# Patient Record
Sex: Male | Born: 1960 | Hispanic: Yes | Marital: Married | Smoking: Never smoker
Health system: Southern US, Community
[De-identification: ages and names within clinical notes are randomized; demographics above are authoritative.]

## PROBLEM LIST (undated history)

## (undated) DIAGNOSIS — I10 Essential (primary) hypertension: Secondary | ICD-10-CM

---

## 2014-08-01 ENCOUNTER — Emergency Department (HOSPITAL_COMMUNITY)
Admission: EM | Admit: 2014-08-01 | Discharge: 2014-08-01 | Disposition: A | Discharge status: Home / Self Care | Payer: Worker's Compensation | Attending: Emergency Medicine | Admitting: Emergency Medicine

## 2014-08-01 ENCOUNTER — Encounter (HOSPITAL_COMMUNITY): Payer: Self-pay | Admitting: Emergency Medicine

## 2014-08-01 ENCOUNTER — Emergency Department (HOSPITAL_COMMUNITY): Payer: Worker's Compensation

## 2014-08-01 DIAGNOSIS — M5442 Lumbago with sciatica, left side: Secondary | ICD-10-CM

## 2014-08-01 DIAGNOSIS — Y9389 Activity, other specified: Secondary | ICD-10-CM | POA: Insufficient documentation

## 2014-08-01 DIAGNOSIS — S0081XA Abrasion of other part of head, initial encounter: Secondary | ICD-10-CM | POA: Insufficient documentation

## 2014-08-01 DIAGNOSIS — Y9289 Other specified places as the place of occurrence of the external cause: Secondary | ICD-10-CM | POA: Insufficient documentation

## 2014-08-01 DIAGNOSIS — S39012A Strain of muscle, fascia and tendon of lower back, initial encounter: Secondary | ICD-10-CM | POA: Insufficient documentation

## 2014-08-01 DIAGNOSIS — W19XXXA Unspecified fall, initial encounter: Secondary | ICD-10-CM

## 2014-08-01 DIAGNOSIS — S8001XA Contusion of right knee, initial encounter: Secondary | ICD-10-CM | POA: Insufficient documentation

## 2014-08-01 DIAGNOSIS — W1809XA Striking against other object with subsequent fall, initial encounter: Secondary | ICD-10-CM | POA: Insufficient documentation

## 2014-08-01 DIAGNOSIS — S3992XA Unspecified injury of lower back, initial encounter: Secondary | ICD-10-CM | POA: Insufficient documentation

## 2014-08-01 DIAGNOSIS — I10 Essential (primary) hypertension: Secondary | ICD-10-CM | POA: Insufficient documentation

## 2014-08-01 HISTORY — DX: Essential (primary) hypertension: I10

## 2014-08-01 MED ORDER — METHOCARBAMOL 750 MG PO TABS: 750.0000 mg | ORAL_TABLET | Freq: Four times a day (QID) | ORAL | Status: AC | PRN

## 2014-08-01 MED ORDER — HYDROCODONE-ACETAMINOPHEN 5-325 MG PO TABS: 1.0000 | ORAL_TABLET | Freq: Four times a day (QID) | ORAL | Status: AC | PRN

## 2014-08-01 MED ORDER — HYDROCODONE-ACETAMINOPHEN 5-325 MG PO TABS
2.0000 | ORAL_TABLET | Freq: Once | ORAL | Status: AC
  Administered 2014-08-01: 2 via ORAL
  Filled 2014-08-01: qty 2

## 2014-08-01 MED ORDER — METHOCARBAMOL 500 MG PO TABS
500.0000 mg | ORAL_TABLET | Freq: Once | ORAL | Status: AC
  Administered 2014-08-01: 500 mg via ORAL
  Filled 2014-08-01: qty 1

## 2014-08-01 MED ORDER — NAPROXEN 500 MG PO TABS: 500.0000 mg | ORAL_TABLET | Freq: Two times a day (BID) | ORAL | Status: AC | PRN

## 2014-08-01 NOTE — ED Notes (Signed)
Pt to xray

## 2014-08-01 NOTE — ED Notes (Signed)
The pt returned from xray.  c-collar still in place

## 2014-08-01 NOTE — ED Notes (Signed)
Pt still in x-ray

## 2014-08-01 NOTE — ED Notes (Signed)
Walked in hallway very well no difficult .

## 2014-08-01 NOTE — ED Notes (Signed)
Pain med given 

## 2014-08-01 NOTE — ED Notes (Signed)
The pts wife has called in.  She does not drive.  He will have to call his boss

## 2014-08-01 NOTE — ED Provider Notes (Signed)
CSN: [id number]     Arrival date & time 08/01/14  1604 History   First MD Initiated Contact with Patient 08/01/14 1604     Chief Complaint  Patient presents with  . Fall     (Consider location/radiation/quality/duration/timing/severity/associated sxs/prior Treatment) The history is provided by the patient and medical records. No language interpreter was used.    Harvel RicksRene Patti is a 53 y.o. 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  Patient reports hitting his face on the ground with fall and subsequent visual changes for several minutes which have resolved and have not returned. Pt also c/o new paresthesias of the left leg since the fall without saddle anesthesia or loss of bowel/bladder control.  Patient arrives via EMS fully immobilized due to mechanism and c/o leg paresthesias. Associated symptoms include right knee pain, right hand pain, nausea.  Patient reports and Zofran given by EMS resolved nausea. Movement makes his back pain knee pain and hand pain worse.  He complains of abrasion to the left cheek.  Pt denies use of blood thinners, fever, headache, neck pain, chest pain or shortness of breath, abdominal pain, vomiting, weakness, syncope.     Past Medical History  Diagnosis Date  . Hypertension    History reviewed. No pertinent past surgical history. No family history on file. History  Substance Use Topics  . Smoking status: Never Smoker   . Smokeless tobacco: Not on file  . Alcohol Use: No    Review of Systems  Constitutional: Negative for fever, chills and fatigue.  HENT: Negative for dental problem, facial swelling and nosebleeds.   Eyes: Negative for visual disturbance.  Respiratory: Negative for cough, chest tightness, shortness of breath, wheezing and stridor.    Cardiovascular: Negative for chest pain.  Gastrointestinal: Positive for nausea. Negative for vomiting, abdominal pain and diarrhea.  Genitourinary: Negative for dysuria, urgency, frequency, hematuria and flank pain.  Musculoskeletal: Positive for arthralgias and back pain. Negative for joint swelling, neck pain and neck stiffness.  Skin: Negative for rash and wound.  Neurological: Negative for syncope, weakness, light-headedness, numbness and headaches.  Hematological: Does not bruise/bleed easily.  Psychiatric/Behavioral: The patient is not nervous/anxious.   All other systems reviewed and are negative.     Allergies  Review of patient's allergies indicates no known allergies.  Home Medications   Prior to Admission medications   Not on File   BP 128/76  Pulse 72  Temp(Src) 97.5 F (36.4 C) (Oral)  Resp 18  Ht 5\' 8"  (1.727 m)  Wt 212 lb (96.163 kg)  BMI 32.24 kg/m2  SpO2 99% Physical Exam  Nursing note and vitals reviewed. Constitutional: He is oriented to person, place, and time. He appears well-developed and well-nourished. No distress.  HENT:  Head: Normocephalic.  Right Ear: Tympanic membrane, external ear and ear canal normal.  Left Ear: Tympanic membrane, external ear and ear canal normal.  Nose: Nose normal. No epistaxis. Right sinus exhibits no maxillary sinus tenderness and no frontal sinus tenderness. Left sinus exhibits no maxillary sinus tenderness and no frontal sinus tenderness.  Mouth/Throat: Uvula is midline, oropharynx is clear and moist and mucous membranes are normal. Mucous membranes are not pale and not cyanotic. No oropharyngeal exudate, posterior oropharyngeal edema, posterior oropharyngeal erythema or tonsillar abscesses.  Small abrasion to the  left cheek  Eyes: Conjunctivae and EOM are normal. Pupils are equal, round, and reactive to light.  Neck: Normal range of motion and full passive range of motion without pain. No spinous process tenderness  and no muscular tenderness present. No rigidity. Normal range of motion present.  C-collar in place - ROM testing deferred Vague midline cervical tenderness; bilateral paraspinal tenderness  Cardiovascular: Normal rate, regular rhythm, normal heart sounds and intact distal pulses.   No murmur heard. Pulses:      Radial pulses are 2+ on the right side, and 2+ on the left side.       Dorsalis pedis pulses are 2+ on the right side, and 2+ on the left side.       Posterior tibial pulses are 2+ on the right side, and 2+ on the left side.  Pulmonary/Chest: Effort normal and breath sounds normal. No accessory muscle usage or stridor. No respiratory distress. He has no decreased breath sounds. He has no wheezes. He has no rhonchi. He has no rales. He exhibits no tenderness and no bony tenderness.  No ecchymosis or contusion No flail segment, crepitus or deformity Equal chest expansion Clear and equal breath sounds  Abdominal: Soft. Normal appearance and bowel sounds are normal. There is no tenderness. There is no rigidity, no guarding and no CVA tenderness.  Small 3x3cm area of healing ecchymosis - pt reports this occurred from a mechanical fall last week Abd soft and nontender; no rigidity or peritoneal signs  Genitourinary:  sensation intact to the perineum  Musculoskeletal: Normal range of motion.       Thoracic back: He exhibits normal range of motion.       Lumbar back: He exhibits normal range of motion.  Full range of motion of the T-spine and L-spine Mild tenderness to palpation of the spinous processes of the T-spine; No tenderness to palpation of the spinous processes of the L-spine Mild tenderness to palpation of the paraspinous muscles of the L-spine Full ROM of the left leg without difficulty Full ROM of the right leg including the right knee without difficulty, but with pain in the knee; contusion to the medial right knee  Lymphadenopathy:    He has no cervical adenopathy.   Neurological: He is alert and oriented to person, place, and time. No cranial nerve deficit. GCS eye subscore is 4. GCS verbal subscore is 5. GCS motor subscore is 6.  Reflex Scores:      Tricep reflexes are 2+ on the right side and 2+ on the left side.      Bicep reflexes are 2+ on the right side and 2+ on the left side.      Brachioradialis reflexes are 2+ on the right side and 2+ on the left side.      Patellar reflexes are 2+ on the right side and 2+ on the left side.      Achilles reflexes are 2+ on the right side and 2+ on the left side. Mental Status:  Alert, oriented, thought content appropriate. Speech fluent without evidence of aphasia. Able to follow 2 step commands without difficulty.  Cranial Nerves:  II:  Peripheral visual fields grossly normal, pupils equal, round, reactive to light III,IV, VI: ptosis not present, extra-ocular motions intact bilaterally  V,VII: smile symmetric, facial light touch sensation equal VIII: hearing grossly normal bilaterally  IX,X: gag reflex present  XI: bilateral shoulder shrug equal and strong XII: midline tongue extension  Motor:  5/5 in upper and lower  extremities bilaterally including strong and equal grip strength and dorsiflexion/plantar flexion Sensory: Pinprick and light touch normal in all extremities though pt endorses increased paresthesias of the left leg with palpation Deep Tendon Reflexes: 2+ and symmetric Bilateral upper and lower extremities Cerebellar: normal finger-to-nose with bilateral upper extremities Gait: gait testing deferred CV: distal pulses palpable throughout   Skin: Skin is warm and dry. No rash noted. He is not diaphoretic. No erythema.  Psychiatric: He has a normal mood and affect.    ED Course  Procedures (including critical care time) Labs Review Labs Reviewed - No data to display  Imaging Review Dg Thoracic Spine 2 View  08/01/2014   CLINICAL DATA:  Initial evaluation for fall off a truck today,  diffuse back pain  EXAM: THORACIC SPINE - 2 VIEW  COMPARISON:  None.  FINDINGS: Normal alignment. No fracture. No paraspinous hematoma. Mild multilevel degenerative disc disease.  IMPRESSION: No acute finding   Electronically Signed   By: Esperanza Heir M.D.   On: 08/01/2014 17:39   Dg Lumbar Spine Complete  08/01/2014   CLINICAL DATA:  Generalized back pain after falling from his truck today.  EXAM: LUMBAR SPINE - COMPLETE 4+ VIEW  COMPARISON:  None.  FINDINGS: Transitional thoracolumbar and lumbosacral vertebrae with 4 intervening non-rib-bearing lumbar vertebrae. Minimal anterior spur formation at multiple levels. No fractures, pars defects or subluxations.  IMPRESSION: No fracture or subluxation.  Minimal degenerative changes.   Electronically Signed   By: Gordan Payment M.D.   On: 08/01/2014 17:41   Ct Head Wo Contrast  08/01/2014   CLINICAL DATA:  Visual changes following a fall. The patient fell getting out of a truck at work today.  EXAM: CT HEAD WITHOUT CONTRAST  CT CERVICAL SPINE WITHOUT CONTRAST  TECHNIQUE: Multidetector CT imaging of the head and cervical spine was performed following the standard protocol without intravenous contrast. Multiplanar CT image reconstructions of the cervical spine were also generated.  COMPARISON:  None.  FINDINGS: CT HEAD FINDINGS  Normal appearing cerebral hemispheres and posterior fossa structures. Normal size and position of the ventricles. No skull fracture, intracranial hemorrhage or paranasal sinus air-fluid levels. Mild bilateral maxillary, ethmoid, frontal and sphenoid sinus mucosal thickening.  CT CERVICAL SPINE FINDINGS  Lower cervical spine degenerative changes. No prevertebral soft tissue swelling, fractures or subluxations.  IMPRESSION: 1. No skull fracture or intracranial hemorrhage. 2. No cervical spine fracture or subluxation. 3. Mild chronic pansinusitis. 4. Lower cervical spine degenerative changes.   Electronically Signed   By: Gordan Payment M.D.    On: 08/01/2014 17:35   Ct Cervical Spine Wo Contrast  08/01/2014   CLINICAL DATA:  Visual changes following a fall. The patient fell getting out of a truck at work today.  EXAM: CT HEAD WITHOUT CONTRAST  CT CERVICAL SPINE WITHOUT CONTRAST  TECHNIQUE: Multidetector CT imaging of the head and cervical spine was performed following the standard protocol without intravenous contrast. Multiplanar CT image reconstructions of the cervical spine were also generated.  COMPARISON:  None.  FINDINGS: CT HEAD FINDINGS  Normal appearing cerebral hemispheres and posterior fossa structures. Normal size and position of the ventricles. No skull fracture, intracranial hemorrhage or paranasal sinus air-fluid levels. Mild bilateral maxillary, ethmoid, frontal and sphenoid sinus mucosal thickening.  CT CERVICAL SPINE FINDINGS  Lower cervical spine degenerative changes. No prevertebral soft tissue swelling, fractures or subluxations.  IMPRESSION: 1. No skull fracture or intracranial hemorrhage. 2. No cervical spine fracture or subluxation. 3. Mild chronic pansinusitis. 4.  Lower cervical spine degenerative changes.   Electronically Signed   By: Gordan PaymentSteve  Reid M.D.   On: 08/01/2014 17:35   Dg Knee Complete 4 Views Right  08/01/2014   CLINICAL DATA:  Initial evaluation for right knee pain after falling off a truck today  EXAM: RIGHT KNEE - COMPLETE 4+ VIEW  COMPARISON:  None.  FINDINGS: There is no evidence of fracture, dislocation, or joint effusion. There is no evidence of arthropathy or other focal bone abnormality. Soft tissues are unremarkable.  IMPRESSION: Negative.   Electronically Signed   By: Esperanza Heiraymond  Rubner M.D.   On: 08/01/2014 17:40     EKG Interpretation None      MDM   Final diagnoses:  Fall, initial encounter  Bilateral low back pain with left-sided sciatica   Harvel RicksRene Edmunds presents with low back pain, mild headache and radiation of pain with associated paresthesias to the left leg after falling.  Normal  neurological exam with gait testing deferred. No concern for lung injury, or intraabdominal injury. Low suspicion for closed head injury.  Normal muscle soreness after fall.   6:45PM Pt reports complete resolution of paresthesias, but persistence of low back pain.  Will give pain control and reassess.  Radiology without acute abnormality.  C-collar removed with a full range of motion of the neck. Patient c-spine clinically cleared.    7:08 PM Patient is able to ambulate without difficulty in the ED and will be discharged home with symptomatic therapy. Repeat neurologic exam is normal. Pt has been instructed to follow up with their doctor if symptoms persist. Home conservative therapies for pain including ice and heat tx have been discussed. Pt is hemodynamically stable, in NAD. Pain has been managed & has no complaints prior to dc.  I have personally reviewed patient's vitals, nursing note and any pertinent labs or imaging.  I performed an undressed physical exam.    It has been determined that no acute conditions requiring further emergency intervention are present at this time. The patient/guardian have been advised of the diagnosis and plan. I reviewed all labs and imaging including any potential incidental findings. We have discussed signs and symptoms that warrant return to the ED, such as loss of bowel or bladder control, saddle anesthesia, worsening pain.  Patient/guardian has voiced understanding and agreed to follow-up with the PCP or specialist in 3 days.  Vital signs are stable at discharge.   BP 128/76  Pulse 72  Temp(Src) 97.5 F (36.4 C) (Oral)  Resp 18  Ht 5\' 8"  (1.727 m)  Wt 212 lb (96.163 kg)  BMI 32.24 kg/m2  SpO2 99%         Dierdre ForthHannah Tanaya Dunigan, PA-C 08/01/14 1911  Dierdre ForthHannah Keisha Amer, PA-C 08/01/14 1913

## 2014-08-01 NOTE — ED Notes (Signed)
The pt arrived by gems he fell getting out of a truck at work.  Abrasions scattered over his upper torso.  No loc.  Numbness lt leg.  lsb removed on arrival to the room. c-c-ollar remains in palce

## 2014-08-01 NOTE — Discharge Instructions (Signed)
1. Medications: robaxin, naproxyn, vicodin, usual home medications 2. Treatment: rest, drink plenty of fluids, gentle stretching as discussed, alternate ice and heat 3. Follow Up: Please followup with your primary doctor for discussion of your diagnoses and further evaluation after today's visit; if you do not have a primary care doctor use the resource guide provided to find one;    Back Exercises Back exercises help treat and prevent back injuries. The goal of back exercises is to increase the strength of your abdominal and back muscles and the flexibility of your back. These exercises should be started when you no longer have back pain. Back exercises include:  Pelvic Tilt. Lie on your back with your knees bent. Tilt your pelvis until the lower part of your back is against the floor. Hold this position 5 to 10 sec and repeat 5 to 10 times.  Knee to Chest. Pull first 1 knee up against your chest and hold for 20 to 30 seconds, repeat this with the other knee, and then both knees. This may be done with the other leg straight or bent, whichever feels better.  Sit-Ups or Curl-Ups. Bend your knees 90 degrees. Start with tilting your pelvis, and do a partial, slow sit-up, lifting your trunk only 30 to 45 degrees off the floor. Take at least 2 to 3 seconds for each sit-up. Do not do sit-ups with your knees out straight. If partial sit-ups are difficult, simply do the above but with only tightening your abdominal muscles and holding it as directed.  Hip-Lift. Lie on your back with your knees flexed 90 degrees. Push down with your feet and shoulders as you raise your hips a couple inches off the floor; hold for 10 seconds, repeat 5 to 10 times.  Back arches. Lie on your stomach, propping yourself up on bent elbows. Slowly press on your hands, causing an arch in your low back. Repeat 3 to 5 times. Any initial stiffness and discomfort should lessen with repetition over time.  Shoulder-Lifts. Lie face down  with arms beside your body. Keep hips and torso pressed to floor as you slowly lift your head and shoulders off the floor. Do not overdo your exercises, especially in the beginning. Exercises may cause you some mild back discomfort which lasts for a few minutes; however, if the pain is more severe, or lasts for more than 15 minutes, do not continue exercises until you see your caregiver. Improvement with exercise therapy for back problems is slow.  See your caregivers for assistance with developing a proper back exercise program. Document Released: 11/09/2004 Document Revised: 12/25/2011 Document Reviewed: 08/03/2011 [facility] Patient Information 2015 [place], [place]. This information is not intended to replace advice given to you by your health care provider. Make sure you discuss any questions you have with your health care provider.  Lumbosacral Strain Lumbosacral strain is a strain of any of the parts that make up your lumbosacral vertebrae. Your lumbosacral vertebrae are the bones that make up the lower third of your backbone. Your lumbosacral vertebrae are held together by muscles and tough, fibrous tissue (ligaments).  CAUSES  A sudden blow to your back can cause lumbosacral strain. Also, anything that causes an excessive stretch of the muscles in the low back can cause this strain. This is typically seen when people exert themselves strenuously, fall, lift heavy objects, bend, or crouch repeatedly. RISK FACTORS  Physically demanding work.  Participation in pushing or pulling sports or sports that require a sudden twist of the  back (tennis, golf, baseball).  Weight lifting.  Excessive lower back curvature.  Forward-tilted pelvis.  Weak back or abdominal muscles or both.  Tight hamstrings. SIGNS AND SYMPTOMS  Lumbosacral strain may cause pain in the area of your injury or pain that moves (radiates) down your leg.  DIAGNOSIS Your health care provider can often diagnose lumbosacral  strain through a physical exam. In some cases, you may need tests such as X-ray exams.  TREATMENT  Treatment for your lower back injury depends on many factors that your clinician will have to evaluate. However, most treatment will include the use of anti-inflammatory medicines. HOME CARE INSTRUCTIONS   Avoid hard physical activities (tennis, racquetball, waterskiing) if you are not in proper physical condition for it. This may aggravate or create problems.  If you have a back problem, avoid sports requiring sudden body movements. Swimming and walking are generally safer activities.  Maintain good posture.  Maintain a healthy weight.  For acute conditions, you may put ice on the injured area.  Put ice in a plastic bag.  Place a towel between your skin and the bag.  Leave the ice on for 20 minutes, 2-3 times a day.  When the low back starts healing, stretching and strengthening exercises may be recommended. SEEK MEDICAL CARE IF:  Your back pain is getting worse.  You experience severe back pain not relieved with medicines. SEEK IMMEDIATE MEDICAL CARE IF:   You have numbness, tingling, weakness, or problems with the use of your arms or legs.  There is a change in bowel or bladder control.  You have increasing pain in any area of the body, including your belly (abdomen).  You notice shortness of breath, dizziness, or feel faint.  You feel sick to your stomach (nauseous), are throwing up (vomiting), or become sweaty.  You notice discoloration of your toes or legs, or your feet get very cold. MAKE SURE YOU:   Understand these instructions.  Will watch your condition.  Will get help right away if you are not doing well or get worse. Document Released: 07/12/2005 Document Revised: 10/07/2013 Document Reviewed: 05/21/2013 [facility] Patient Information 2015 [place]. This information is not intended to replace advice given to you by your health care provider. Make sure  you discuss any questions you have with your health care provider.    Emergency Department Resource Guide 1) Find a Doctor and Pay Out of Pocket Although you won't have to find out who is covered by your insurance plan, it is a good idea to ask around and get recommendations. You will then need to call the office and see if the doctor you have chosen will accept you as a new patient and what types of options they offer for patients who are self-pay. Some doctors offer discounts or will set up payment plans for their patients who do not have insurance, but you will need to ask so you aren't surprised when you get to your appointment.  2) Contact Your Local Health Department Not all health departments have doctors that can see patients for sick visits, but many do, so it is worth a call to see if yours does. If you don't know where your local health department is, you can check in your phone book. The CDC also has a tool to help you locate your state's health department, and many state websites also have listings of all of their local health departments.  3) Find a Walk-in Clinic If your illness is not likely  to be very severe or complicated, you may want to try a walk in clinic. These are popping up all over the country in pharmacies, drugstores, and shopping centers. They're usually staffed by nurse practitioners or physician assistants that have been trained to treat common illnesses and complaints. They're usually fairly quick and inexpensive. However, if you have serious medical issues or chronic medical problems, these are probably not your best option.  No Primary Care Doctor: - Call Health Connect at  [phone] - they can help you locate a primary care doctor that  accepts your insurance, provides certain services, etc. - Physician Referral Service- [phone]  Chronic Pain Problems: Organization         Address  Phone   Notes  [facility]  [phone] Patients need  to be referred by their primary care doctor.   Medication Assistance: Organization         Address  Phone   Notes  [facility] Medication [facility] [address] [place] [place], [place] [address] [phone] --Must be a resident of [facility] -- Must have NO insurance coverage whatsoever (no Medicaid/ Medicare, etc.) -- The pt. MUST have a primary care doctor that directs their care regularly and follows them in the community   [facility]  [phone]   [employer]  [phone]    Agencies that provide inexpensive medical care: Organization         Address  Phone   Notes  [facility]  [phone]   [facility]    [phone]   [facility] [address] [place], [place] [address] [phone]   [facility] [address] [place] [place] [phone]   Planned Parenthood    [phone]   [facility]    [phone]   [facility]  [address], [place] Phone:  [phone], Fax:  [phone] Hours of Operation:  9 am - 6 pm, M-F.  Also accepts Medicaid/Medicare and self-pay.  [facility]  [address], [place] Phone: [phone], Fax: [phone]. Hours of Operation:  8:30 am - 5:30 pm, M-F.  Also accepts Medicaid and self-pay.  [facility] [place] [address], [place] Phone: [phone]   [facility] [address] Natasha Bence [place], [place] [phone], Ext. [phone] Mondays & Thursdays: 7-9 AM.  First 15 patients are seen on a first come, first serve basis.    Medicaid-accepting [facility] Providers:  Organization         Address  Phone   Notes  [facility] [address], [place] [phone] Also accepts self-pay patients.  [facility] [address] Laurell Josephs [place], [place]  [phone]   [facility] [address], [place] [phone]   [facility] [address] [place] [phone]   Renaye Rakers [address], [place]   [phone] Only accepts [place] patients after they have their name applied to their card.   Self-Pay (no insurance) in [facility]:  Organization         Address  Phone   Notes  Sickle Cell Patients, [facility] [address] [place], [place] [phone]   [facility] [address] [place], [place] [phone]   [facility] [place]  [address], [place] [phone]   [facility]/Dr. Osei-Bonsu  [address] [place] or [address], [place] [phone] Phone number for both [place] and [place] locations is the same.  [facility] [address], [place] [phone]   [facility] [address] [place] or [address] [phone]   [facility] [address] [place] [phone], phone; [phone], fax Sees patients 1st and 3rd Saturday of every month.  Must not qualify for public or private insurance (i.e. Medicaid, Medicare, [place] Health Choice, Veterans' Benefits)  Household income should be no more than 200% of the poverty level The clinic cannot treat you if you are pregnant or think you are pregnant  Sexually transmitted diseases are not treated at the clinic.   Dental Care: Organization         Address  Phone  Notes  [facility] Department of [facility] [address] [place], [place] [phone] Accepts children up to age 35 who are enrolled in [place] or [place] Health Choice; pregnant women with a Medicaid card; and children who have applied for Medicaid or [place] Health Choice, but were declined, whose  parents can pay a reduced fee at time of service.  [facility] Department of [facility]  [address], [place] [phone] Accepts children up to age 76 who are enrolled in [place] or [place] Health Choice; pregnant women with a Medicaid card; and children who have applied for Medicaid or [place] Health Choice, but were declined, whose parents can pay a reduced fee at time of service.  [facility]  [address] [place], [place] [phone] Patients are seen by appointment only. Walk-ins are not accepted. [facility] will see patients 22 years of age and older. Monday - Tuesday (8am-5pm) Most Wednesdays (8:30-5pm) $30 per visit, cash only  [facility]  [address], [facility] [phone] Patients are seen by appointment only. Walk-ins are not accepted. [facility] will see patients 21 years of age and older. One Wednesday Evening (Monthly: Volunteer Based).  $30 per visit, cash only  [employer]  [phone] for adults; Children under age 72, call Graduate Pediatric Dentistry at [phone]. Children aged 41-14, please call [phone] to request a pediatric application.  Dental services are provided in all areas of dental care including fillings, crowns and bridges, complete and partial dentures, implants, gum treatment, root canals, and extractions. Preventive care is also provided. Treatment is provided to both adults and children. Patients are selected via a lottery and there is often a waiting list.   [facility] [address] [place]  [phone] [email or web address]   [facility] [address] [place], [place] [phone], Ext. [phone] Second and Fourth Thursday of each month, opens at 6:30 AM; Clinic ends at 9 AM.  Patients are seen on a first-come first-served basis, and a limited number are seen during each clinic.   [facility]   [address] Ether Griffins [place], [place] [phone]   Eligibility Requirements You must have lived in [place], [place], or [place] counties for at least the last three months.  You cannot be eligible for state or federal sponsored [place] Cityhealthcare insurance, including CIG[employer]eterans Administration, [place]icaid, or [employer]Medicare.   You generally cannot be eligible for healthcare insurance through your employer.    How to apply: Eligibility screenings are held every Tuesday and Wednesday afternoon from 1:00 pm until 4:00 pm. You do not need an appointment for the interview!  [facility] [address], [place], [place] [phone]   [facility]  [phone]   [facility]  [phone]   [facility]  [phone]    Behavioral Health Resources in the Community: Intensive Outpatient Programs Organization         Address  Phone  Notes  [facility] [address], [place], [place] [phone]   [facility] [address], [place], [place] [phone]   [facility] [address], [place], [place]  [phone]   [facility] [address],  [place], [place] [phone] or [phone]   Substance Abuse Resources Organization         Address  Phone  Notes  [facility]  [phone]   [facility]  [phone]   [facility]  [phone]   [facility]  [phone]   Residential & Outpatient Substance Abuse Program  [phone]   Psychological Services Organization         Address  Phone  Notes  [facility]  [phone]- [phone]   [facility]  [phone]- [phone]   [facility] [address], [place] [phone] or [phone]    Mobile Crisis Teams Organization         Address  Phone  Notes  [facility], Mobile Crisis Care Unit  [phone]     [facility]  [address] [place], [place] [phone]   Doristine LocksSharon DeEsch [address] [place] [phone]    Self-Help/Support Groups Organization         Address  Phone             Notes  [facility] [place] - variety of support groups  336- [phone] Call for more information  Narcotics Anonymous (NA), [facility] [address], [place]  2 meetings at this location   Statisticianesidential Treatment Programs Organization         Address  Phone  Notes  [facility] [address],    [place]  [phone]   [facility]  [address], [place] [phone], [place], [place] [phone]   [facility] [address], [place] [phone] Admissions: 8am-3pm M-F  [facility] [address]    [place], [place] [phone]   [facility] [address], [place], [place] [phone]   [facility] [address]  [place], [place] [phone]   [facility] - Intensive Outpatient [address], [place], [place] [phone]   [facility] ([facility] [address] [place] [address]  [place], [place] [phone] or [phone]   [facility] ([facility]) [address] [place], [place] [phone] Accepts Medicaid  [facility] [address]  [place] [phone] Substance Abuse/Addiction Treatment   [facility] Resources Organization         Address  Phone  Notes  [facility]  [phone]   Angie FavaJulie Brannon, PhD [phone] [address], [address] [place], [place]   [phone] or [phone]   [facility]   [address] [place], [place] [phone](336) [phone]   [facility] [address], [place], [place] [phone] Insurance/Medicaid/sponsorship through [facility]  [facility] [address]                                     [place], [place] [phone] Therapy/tele-psych/case  [facility] [address]   [place], [place] [phone]    Dr. Lolly MustacheArfeen  [phone]   [facility]  [employer] [facility] 1) [address],  2) [address], [place] 3)  [address], [place] [phone]) [phone]  [phone]   [facility] Child Abuse Hotline [phone]) [phone] or [phone]) [phone] (After Hours)

## 2014-08-01 NOTE — ED Notes (Signed)
Pt c/oa severe headache ?

## 2014-08-02 NOTE — ED Provider Notes (Signed)
Medical screening examination/treatment/procedure(s) were performed by non-physician practitioner and as supervising physician I was immediately available for consultation/collaboration.  Lila Lufkin L Carlisia Geno, MD 08/02/14 0120 

## 2016-01-10 IMAGING — CR DG LUMBAR SPINE COMPLETE 4+V
5 series · 5 of 5 positions shown · non-contrast
Comparison: None.

CLINICAL DATA: Generalized back pain after falling from his truck
today.

EXAM:
LUMBAR SPINE - COMPLETE 4+ VIEW

[t l-spine a.p.]
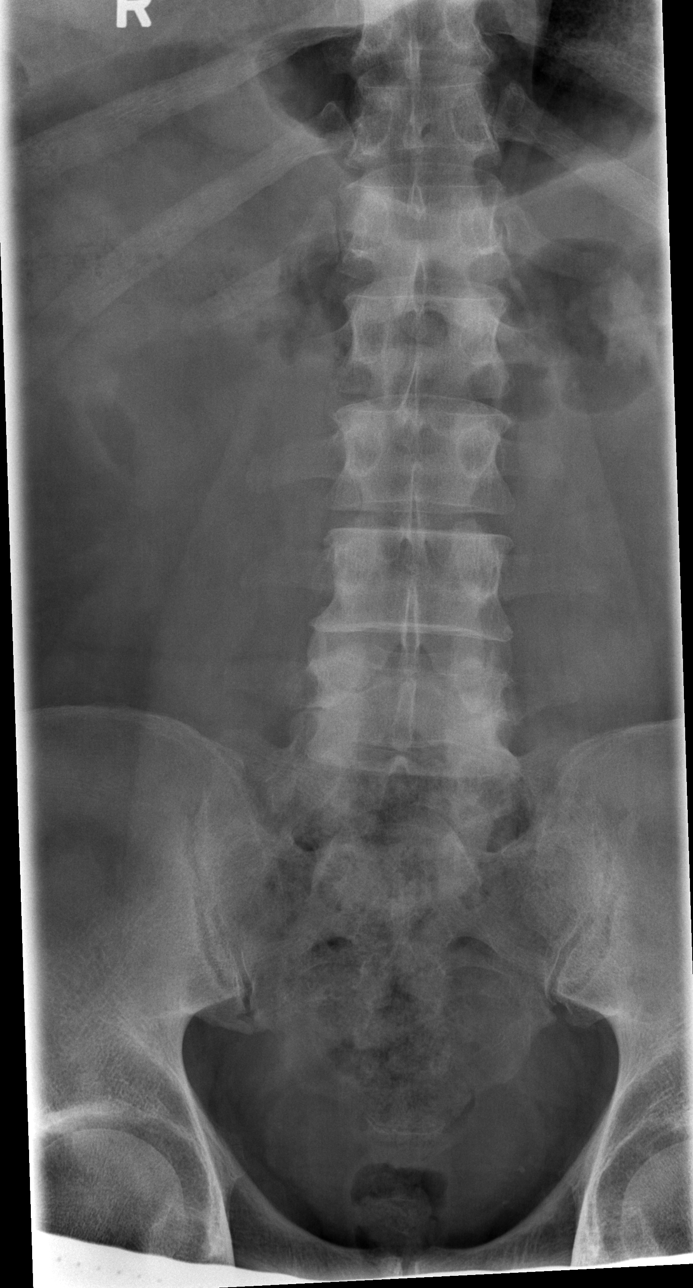

[t l-spine oblique exposure (1 of 2)]
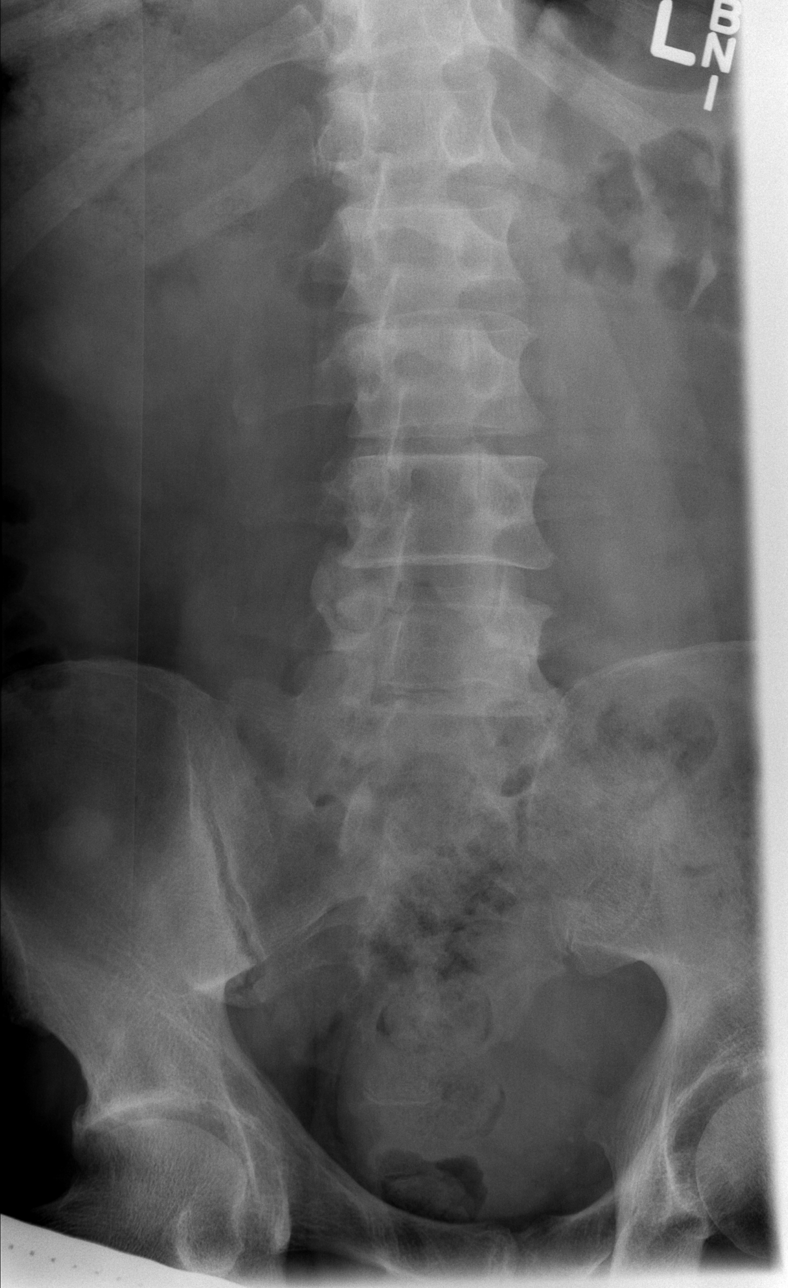

[t l-spine oblique exposure (2 of 2)]
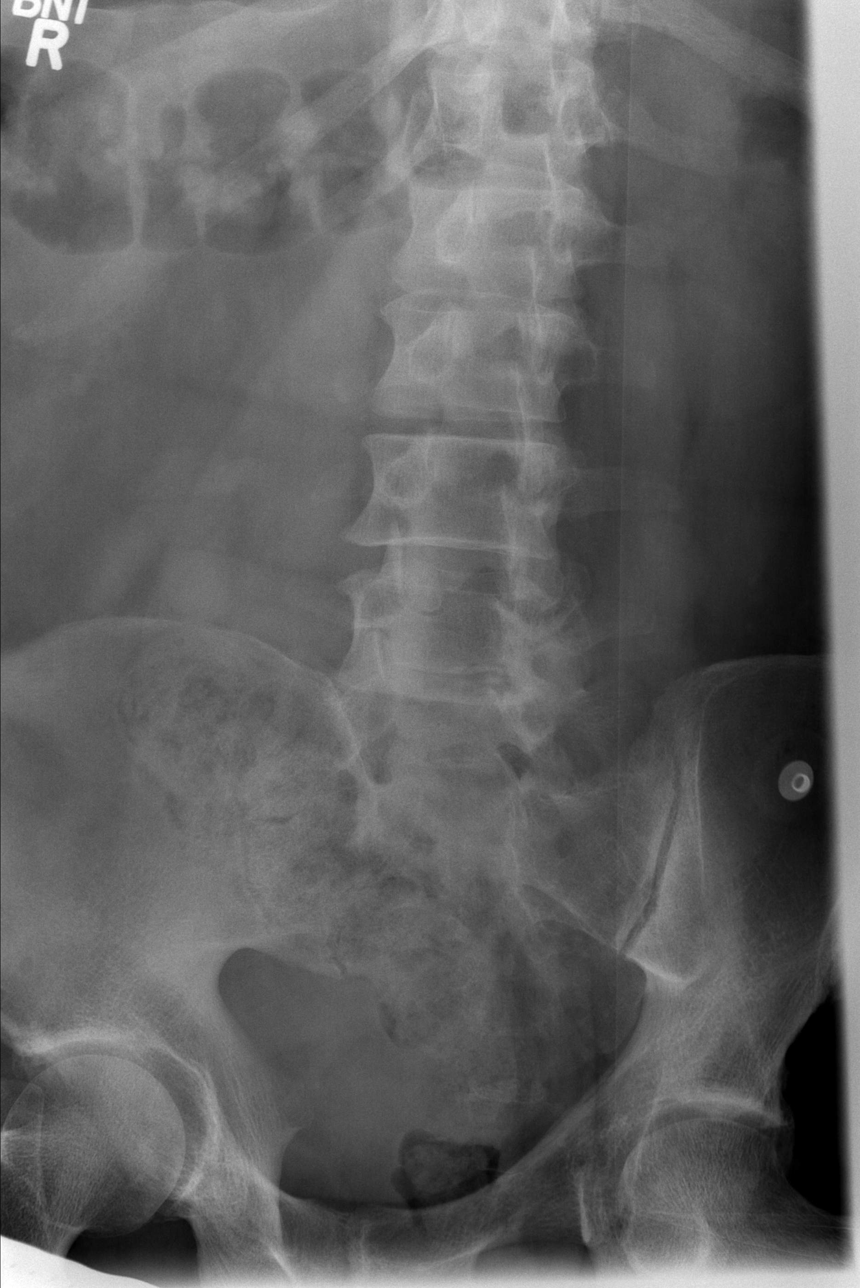

[t l-spine lat *]
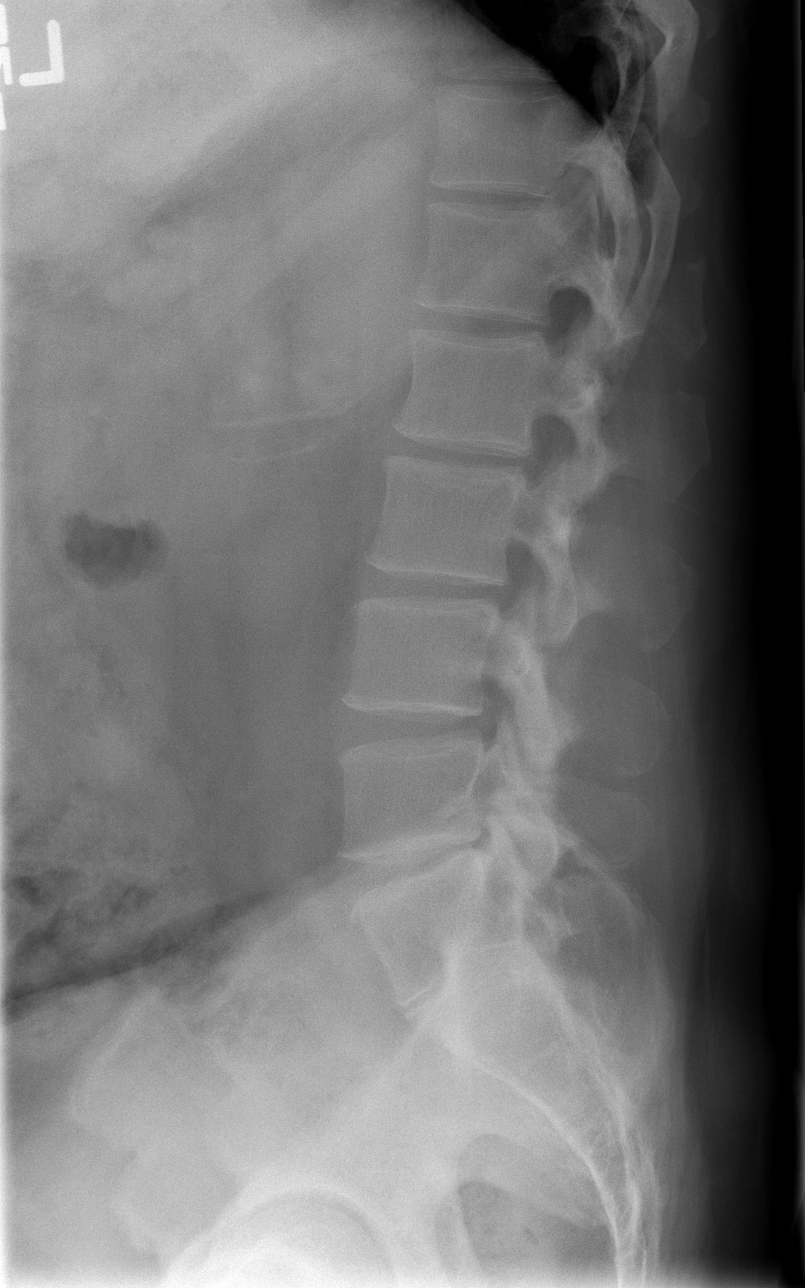

[t l-spine l5-s1 spot *]
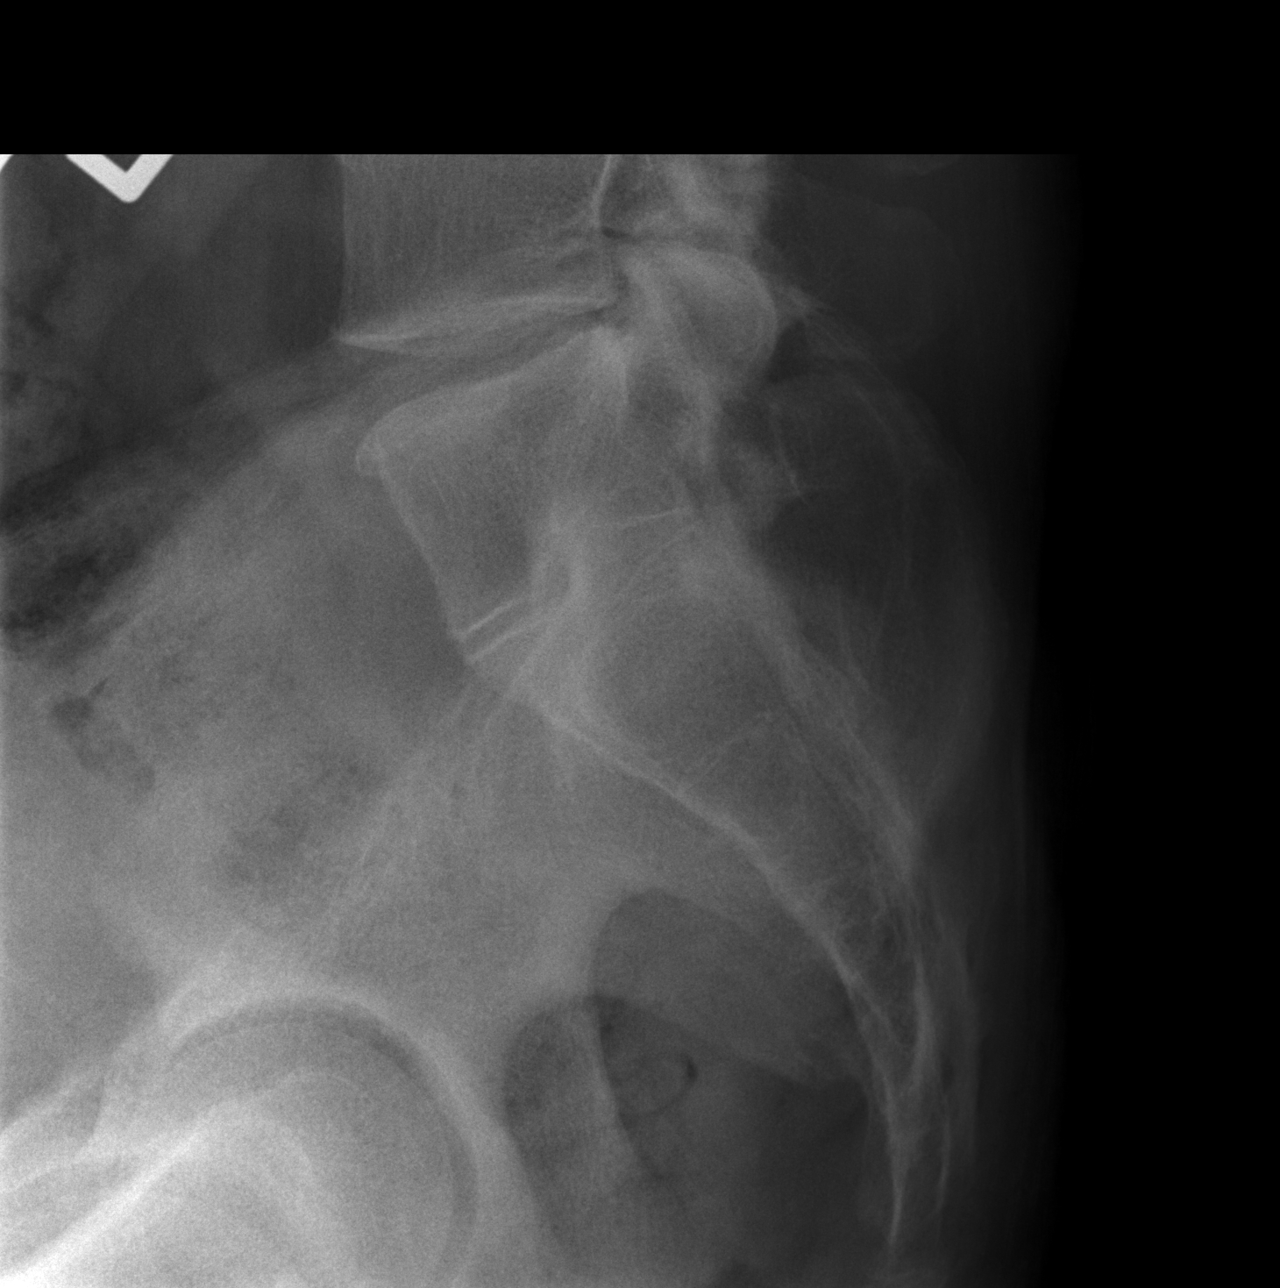

[5 of 5 positions shown; findings below may reference images not displayed]

FINDINGS: Transitional thoracolumbar and lumbosacral vertebrae with 4
intervening non-rib-bearing lumbar vertebrae. Minimal anterior spur
formation at multiple levels. No fractures, pars defects or
subluxations.
IMPRESSION: No fracture or subluxation.  Minimal degenerative changes.
